# Patient Record
Sex: Male | Born: 1940 | Race: White | Hispanic: No | Marital: Single | Smoking: Never smoker
Health system: Southern US, Community
[De-identification: ages and names within clinical notes are randomized; demographics above are authoritative.]

---

## 2006-04-30 ENCOUNTER — Ambulatory Visit (HOSPITAL_COMMUNITY): Admission: RE | Admit: 2006-04-30 | Discharge: 2006-04-30 | Payer: Self-pay | Admitting: Family Medicine

## 2007-10-17 ENCOUNTER — Ambulatory Visit (HOSPITAL_COMMUNITY): Admission: RE | Admit: 2007-10-17 | Discharge: 2007-10-17 | Payer: Self-pay | Admitting: Family Medicine

## 2007-11-26 ENCOUNTER — Ambulatory Visit (HOSPITAL_COMMUNITY): Admission: RE | Admit: 2007-11-26 | Discharge: 2007-11-26 | Payer: Self-pay | Admitting: Family Medicine

## 2010-04-12 ENCOUNTER — Encounter: Payer: Self-pay | Admitting: Internal Medicine

## 2010-04-13 ENCOUNTER — Encounter: Payer: Self-pay | Admitting: Internal Medicine

## 2010-04-24 ENCOUNTER — Encounter: Payer: Self-pay | Admitting: Orthopaedic Surgery

## 2010-04-25 ENCOUNTER — Encounter: Payer: Self-pay | Admitting: Family Medicine

## 2010-04-25 ENCOUNTER — Ambulatory Visit (HOSPITAL_COMMUNITY): Admission: RE | Admit: 2010-04-25 | Payer: Self-pay | Source: Home / Self Care | Admitting: Internal Medicine

## 2010-05-05 NOTE — Letter (Addendum)
Summary: TRIAGE  TRIAGE   Imported By: Rexene Alberts 04/12/2010 15:48:41  _____________________________________________________________________  External Attachment:    Type:   Image     Comment:   External Document  Appended Document: TRIAGE ok as is  Appended Document: TRIAGE Rx and instructions were faxed to [facility] and order faxed to Kim.  Appended Document: TRIAGE Per Melanie in endo she called pt to remind him about his appt. and he stated he would like to cx because he has a family emergency. He will call the office to reschedule at a later time.

## 2010-05-05 NOTE — Letter (Signed)
Summary: OP REPORT FLEX SIG 03/1999  OP REPORT FLEX SIG 03/1999   Imported By: Rexene Alberts 04/12/2010 15:46:46  _____________________________________________________________________  External Attachment:    Type:   Image     Comment:   External Document

## 2010-05-05 NOTE — Letter (Signed)
Summary: [facility] PREP INSTRUCTIONS  [facility] PREP INSTRUCTIONS   Imported By: Rexene Alberts 04/13/2010 10:27:22  _____________________________________________________________________  External Attachment:    Type:   Image     Comment:   External Document

## 2012-12-03 ENCOUNTER — Ambulatory Visit (HOSPITAL_COMMUNITY)
Admission: RE | Admit: 2012-12-03 | Discharge: 2012-12-03 | Disposition: A | Discharge status: Home / Self Care | Payer: Medicare Other | Source: Ambulatory Visit | Attending: Family Medicine | Admitting: Family Medicine

## 2012-12-03 ENCOUNTER — Other Ambulatory Visit (HOSPITAL_COMMUNITY): Payer: Self-pay | Admitting: Family Medicine

## 2012-12-03 DIAGNOSIS — M25569 Pain in unspecified knee: Secondary | ICD-10-CM | POA: Insufficient documentation

## 2012-12-03 DIAGNOSIS — M1990 Unspecified osteoarthritis, unspecified site: Secondary | ICD-10-CM

## 2012-12-03 DIAGNOSIS — M25469 Effusion, unspecified knee: Secondary | ICD-10-CM | POA: Insufficient documentation

## 2017-06-04 ENCOUNTER — Other Ambulatory Visit (HOSPITAL_COMMUNITY): Payer: Self-pay | Admitting: Family Medicine

## 2017-06-04 DIAGNOSIS — M545 Low back pain: Secondary | ICD-10-CM

## 2017-06-14 ENCOUNTER — Ambulatory Visit (HOSPITAL_COMMUNITY): Payer: Medicare HMO

## 2017-06-20 ENCOUNTER — Ambulatory Visit (HOSPITAL_COMMUNITY)
Admission: RE | Admit: 2017-06-20 | Discharge: 2017-06-20 | Disposition: A | Discharge status: Home / Self Care | Payer: Medicare HMO | Source: Ambulatory Visit | Attending: Family Medicine | Admitting: Family Medicine

## 2017-06-20 DIAGNOSIS — M5126 Other intervertebral disc displacement, lumbar region: Secondary | ICD-10-CM | POA: Diagnosis not present

## 2017-06-20 DIAGNOSIS — M8938 Hypertrophy of bone, other site: Secondary | ICD-10-CM | POA: Insufficient documentation

## 2017-06-20 DIAGNOSIS — M48061 Spinal stenosis, lumbar region without neurogenic claudication: Secondary | ICD-10-CM | POA: Insufficient documentation

## 2017-06-20 DIAGNOSIS — M545 Low back pain: Secondary | ICD-10-CM | POA: Diagnosis present

## 2017-10-08 ENCOUNTER — Ambulatory Visit (HOSPITAL_COMMUNITY)
Admission: RE | Admit: 2017-10-08 | Discharge: 2017-10-08 | Disposition: A | Discharge status: Home / Self Care | Payer: Medicare HMO | Source: Ambulatory Visit | Attending: Family Medicine | Admitting: Family Medicine

## 2017-10-08 ENCOUNTER — Other Ambulatory Visit (HOSPITAL_COMMUNITY): Payer: Self-pay | Admitting: Family Medicine

## 2017-10-08 DIAGNOSIS — M65872 Other synovitis and tenosynovitis, left ankle and foot: Secondary | ICD-10-CM | POA: Insufficient documentation

## 2017-10-08 DIAGNOSIS — M779 Enthesopathy, unspecified: Secondary | ICD-10-CM

## 2019-09-11 IMAGING — DX DG OS CALCIS 2+V*L*
2 series · 2 of 2 positions shown · non-contrast
Comparison: None.

CLINICAL DATA: Bone spur.  Heel pain

EXAM:
LEFT OS CALCIS - 2+ VIEW

[calcaneus axial]
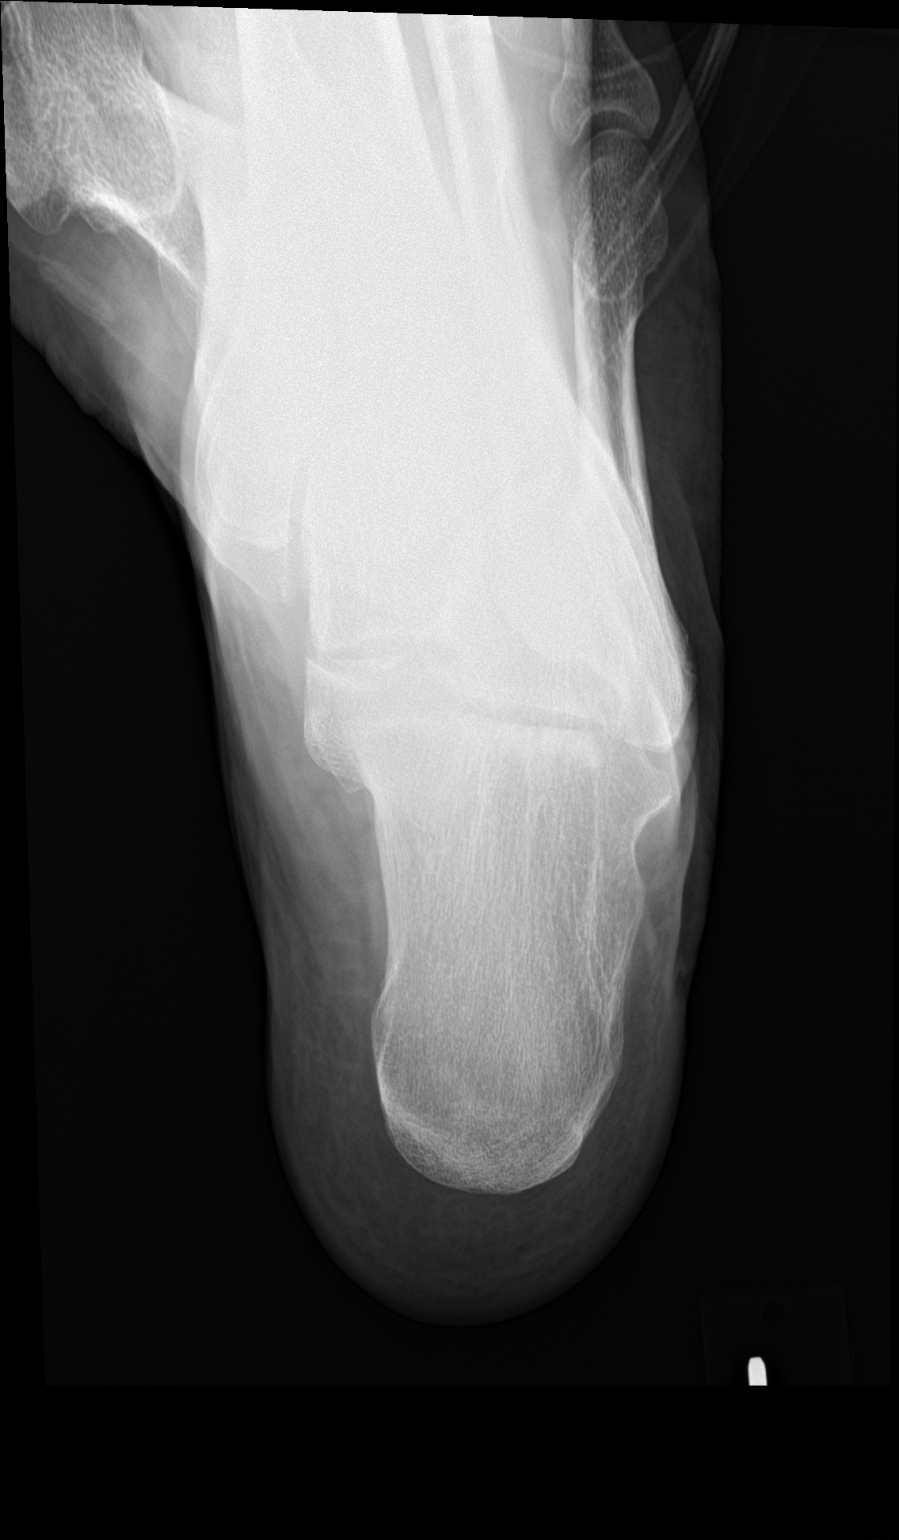

[calcaneus lat]
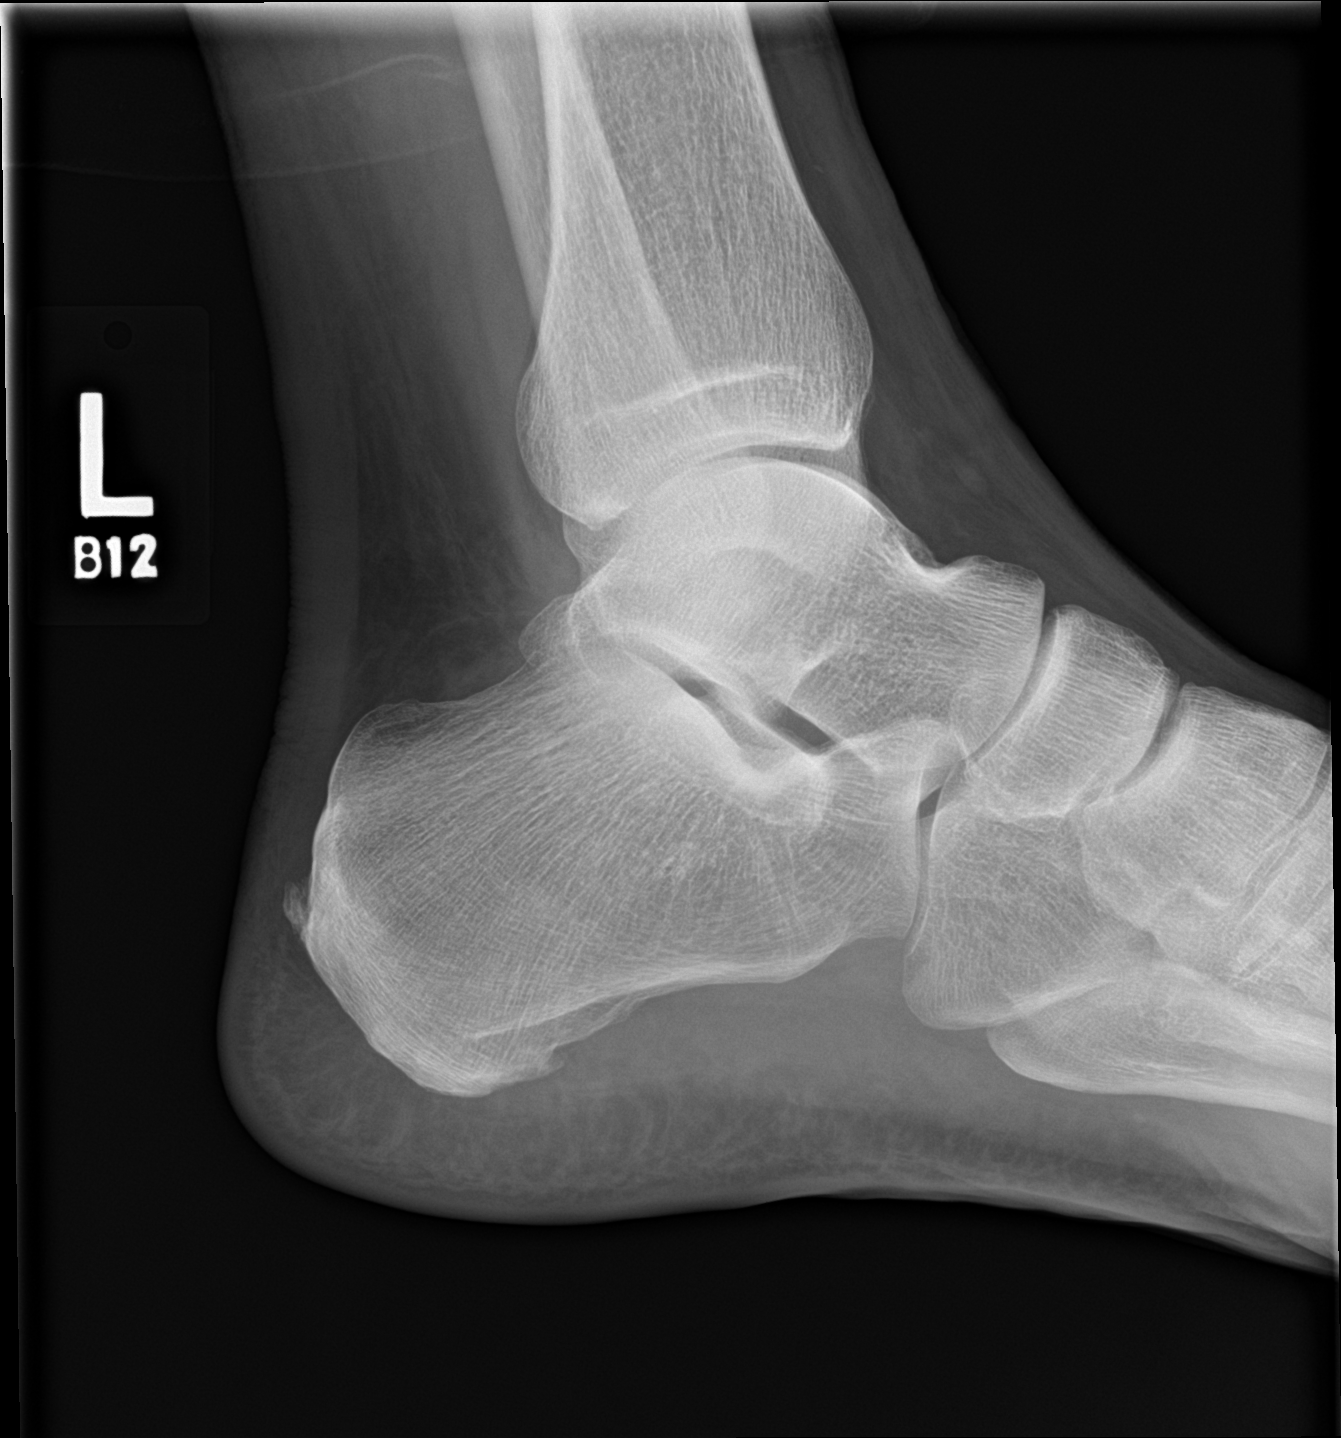

[2 of 2 positions shown; findings below may reference images not displayed]

FINDINGS: Normal alignment no fracture.  No bone lesion

Small amount of calcification at the Achilles tendon insertion. No
significant plantar spurring.
IMPRESSION: Mild calcification in the Achilles tendon.

## 2021-04-05 DIAGNOSIS — R110 Nausea: Secondary | ICD-10-CM | POA: Diagnosis not present

## 2021-04-05 DIAGNOSIS — J019 Acute sinusitis, unspecified: Secondary | ICD-10-CM | POA: Diagnosis not present

## 2021-04-05 DIAGNOSIS — R42 Dizziness and giddiness: Secondary | ICD-10-CM | POA: Diagnosis not present

## 2021-06-09 DIAGNOSIS — I10 Essential (primary) hypertension: Secondary | ICD-10-CM | POA: Diagnosis not present

## 2021-06-09 DIAGNOSIS — R7301 Impaired fasting glucose: Secondary | ICD-10-CM | POA: Diagnosis not present

## 2021-06-20 DIAGNOSIS — Z9889 Other specified postprocedural states: Secondary | ICD-10-CM | POA: Diagnosis not present

## 2021-06-20 DIAGNOSIS — G64 Other disorders of peripheral nervous system: Secondary | ICD-10-CM | POA: Diagnosis not present

## 2021-06-20 DIAGNOSIS — I10 Essential (primary) hypertension: Secondary | ICD-10-CM | POA: Diagnosis not present

## 2021-06-20 DIAGNOSIS — K219 Gastro-esophageal reflux disease without esophagitis: Secondary | ICD-10-CM | POA: Diagnosis not present

## 2021-06-20 DIAGNOSIS — G8929 Other chronic pain: Secondary | ICD-10-CM | POA: Diagnosis not present

## 2021-06-20 DIAGNOSIS — E782 Mixed hyperlipidemia: Secondary | ICD-10-CM | POA: Diagnosis not present

## 2021-06-20 DIAGNOSIS — D696 Thrombocytopenia, unspecified: Secondary | ICD-10-CM | POA: Diagnosis not present

## 2021-09-20 DIAGNOSIS — I10 Essential (primary) hypertension: Secondary | ICD-10-CM | POA: Diagnosis not present

## 2021-11-08 DIAGNOSIS — D72825 Bandemia: Secondary | ICD-10-CM | POA: Diagnosis not present

## 2021-11-08 DIAGNOSIS — R001 Bradycardia, unspecified: Secondary | ICD-10-CM | POA: Diagnosis not present

## 2021-11-08 DIAGNOSIS — D649 Anemia, unspecified: Secondary | ICD-10-CM | POA: Diagnosis not present

## 2021-11-08 DIAGNOSIS — I679 Cerebrovascular disease, unspecified: Secondary | ICD-10-CM | POA: Diagnosis not present

## 2021-11-08 DIAGNOSIS — M549 Dorsalgia, unspecified: Secondary | ICD-10-CM | POA: Diagnosis not present

## 2021-11-08 DIAGNOSIS — R825 Elevated urine levels of drugs, medicaments and biological substances: Secondary | ICD-10-CM | POA: Diagnosis not present

## 2021-11-08 DIAGNOSIS — W19XXXA Unspecified fall, initial encounter: Secondary | ICD-10-CM | POA: Diagnosis not present

## 2021-11-08 DIAGNOSIS — R059 Cough, unspecified: Secondary | ICD-10-CM | POA: Diagnosis not present

## 2021-11-08 DIAGNOSIS — R42 Dizziness and giddiness: Secondary | ICD-10-CM | POA: Diagnosis not present

## 2021-11-08 DIAGNOSIS — R296 Repeated falls: Secondary | ICD-10-CM | POA: Diagnosis not present

## 2021-11-08 DIAGNOSIS — Y92009 Unspecified place in unspecified non-institutional (private) residence as the place of occurrence of the external cause: Secondary | ICD-10-CM | POA: Diagnosis not present

## 2021-11-08 DIAGNOSIS — G8929 Other chronic pain: Secondary | ICD-10-CM | POA: Diagnosis not present

## 2021-11-08 DIAGNOSIS — D696 Thrombocytopenia, unspecified: Secondary | ICD-10-CM | POA: Diagnosis not present

## 2021-12-22 DIAGNOSIS — R7301 Impaired fasting glucose: Secondary | ICD-10-CM | POA: Diagnosis not present

## 2021-12-22 DIAGNOSIS — I10 Essential (primary) hypertension: Secondary | ICD-10-CM | POA: Diagnosis not present

## 2021-12-29 DIAGNOSIS — Z0000 Encounter for general adult medical examination without abnormal findings: Secondary | ICD-10-CM | POA: Diagnosis not present

## 2021-12-29 DIAGNOSIS — Z23 Encounter for immunization: Secondary | ICD-10-CM | POA: Diagnosis not present

## 2021-12-29 DIAGNOSIS — G64 Other disorders of peripheral nervous system: Secondary | ICD-10-CM | POA: Diagnosis not present

## 2021-12-29 DIAGNOSIS — E782 Mixed hyperlipidemia: Secondary | ICD-10-CM | POA: Diagnosis not present

## 2021-12-29 DIAGNOSIS — D696 Thrombocytopenia, unspecified: Secondary | ICD-10-CM | POA: Diagnosis not present

## 2021-12-29 DIAGNOSIS — Z9889 Other specified postprocedural states: Secondary | ICD-10-CM | POA: Diagnosis not present

## 2021-12-29 DIAGNOSIS — K219 Gastro-esophageal reflux disease without esophagitis: Secondary | ICD-10-CM | POA: Diagnosis not present

## 2021-12-29 DIAGNOSIS — Z0001 Encounter for general adult medical examination with abnormal findings: Secondary | ICD-10-CM | POA: Diagnosis not present

## 2021-12-29 DIAGNOSIS — I10 Essential (primary) hypertension: Secondary | ICD-10-CM | POA: Diagnosis not present

## 2022-01-19 ENCOUNTER — Other Ambulatory Visit (HOSPITAL_COMMUNITY): Payer: Self-pay | Admitting: Internal Medicine

## 2022-01-19 DIAGNOSIS — R112 Nausea with vomiting, unspecified: Secondary | ICD-10-CM | POA: Diagnosis not present

## 2022-01-20 ENCOUNTER — Other Ambulatory Visit: Payer: Self-pay

## 2022-01-20 ENCOUNTER — Emergency Department (HOSPITAL_COMMUNITY): Payer: Medicare Other

## 2022-01-20 ENCOUNTER — Emergency Department (HOSPITAL_COMMUNITY)
Admission: EM | Admit: 2022-01-20 | Discharge: 2022-01-20 | Disposition: A | Discharge status: Home / Self Care | Payer: Medicare Other | Attending: Emergency Medicine | Admitting: Emergency Medicine

## 2022-01-20 ENCOUNTER — Encounter (HOSPITAL_COMMUNITY): Payer: Self-pay | Admitting: Emergency Medicine

## 2022-01-20 DIAGNOSIS — R001 Bradycardia, unspecified: Secondary | ICD-10-CM | POA: Insufficient documentation

## 2022-01-20 DIAGNOSIS — Z20822 Contact with and (suspected) exposure to covid-19: Secondary | ICD-10-CM | POA: Insufficient documentation

## 2022-01-20 DIAGNOSIS — K5730 Diverticulosis of large intestine without perforation or abscess without bleeding: Secondary | ICD-10-CM | POA: Diagnosis not present

## 2022-01-20 DIAGNOSIS — R112 Nausea with vomiting, unspecified: Secondary | ICD-10-CM

## 2022-01-20 DIAGNOSIS — N281 Cyst of kidney, acquired: Secondary | ICD-10-CM | POA: Diagnosis not present

## 2022-01-20 DIAGNOSIS — R1084 Generalized abdominal pain: Secondary | ICD-10-CM | POA: Diagnosis not present

## 2022-01-20 LAB — COMPREHENSIVE METABOLIC PANEL
ALT: 14 U/L (ref 0–44)
AST: 23 U/L (ref 15–41)
Albumin: 4 g/dL (ref 3.5–5.0)
Alkaline Phosphatase: 58 U/L (ref 38–126)
Anion gap: 8 (ref 5–15)
BUN: 18 mg/dL (ref 8–23)
CO2: 28 mmol/L (ref 22–32)
Calcium: 8.9 mg/dL (ref 8.9–10.3)
Chloride: 104 mmol/L (ref 98–111)
Creatinine, Ser: 0.92 mg/dL (ref 0.61–1.24)
GFR, Estimated: >60 mL/min (ref >60)
Glucose, Bld: 108 mg/dL — ABNORMAL HIGH (ref 70–99)
Potassium: 3.8 mmol/L (ref 3.5–5.1)
Sodium: 140 mmol/L (ref 135–145)
Total Bilirubin: 1.7 mg/dL — ABNORMAL HIGH (ref 0.3–1.2)
Total Protein: 7.3 g/dL (ref 6.5–8.1)

## 2022-01-20 LAB — CBC
HCT: 39.8 % (ref 39.0–52.0)
Hemoglobin: 14.2 g/dL (ref 13.0–17.0)
MCH: 33.9 pg (ref 26.0–34.0)
MCHC: 35.7 g/dL (ref 30.0–36.0)
MCV: 95 fL (ref 80.0–100.0)
Platelets: 107 10*3/uL — ABNORMAL LOW (ref 150–400)
RBC: 4.19 MIL/uL — ABNORMAL LOW (ref 4.22–5.81)
RDW: 13.6 % (ref 11.5–15.5)
WBC: 5.3 10*3/uL (ref 4.0–10.5)
nRBC: 0 % (ref 0.0–0.2)

## 2022-01-20 LAB — URINALYSIS, ROUTINE W REFLEX MICROSCOPIC
Bilirubin Urine: NEGATIVE
Glucose, UA: NEGATIVE mg/dL
Hgb urine dipstick: NEGATIVE
Ketones, ur: 5 mg/dL — AB
Leukocytes,Ua: NEGATIVE
Nitrite: NEGATIVE
Protein, ur: NEGATIVE mg/dL
Specific Gravity, Urine: 1.012 (ref 1.005–1.030)
pH: 8 (ref 5.0–8.0)

## 2022-01-20 LAB — LIPASE, BLOOD: Lipase: 26 U/L (ref 11–51)

## 2022-01-20 LAB — RESP PANEL BY RT-PCR (FLU A&B, COVID) ARPGX2
Influenza A by PCR: NEGATIVE
Influenza B by PCR: NEGATIVE
SARS Coronavirus 2 by RT PCR: NEGATIVE

## 2022-01-20 LAB — MAGNESIUM: Magnesium: 2.1 mg/dL (ref 1.7–2.4)

## 2022-01-20 MED ORDER — IOHEXOL 300 MG/ML  SOLN
100.0000 mL | Freq: Once | INTRAMUSCULAR | Status: AC | PRN
  Administered 2022-01-20: 100 mL via INTRAVENOUS

## 2022-01-20 MED ORDER — SODIUM CHLORIDE 0.9 % IV BOLUS
1000.0000 mL | Freq: Once | INTRAVENOUS | Status: AC
  Administered 2022-01-20: 1000 mL via INTRAVENOUS

## 2022-01-20 MED ORDER — PANTOPRAZOLE SODIUM 40 MG IV SOLR
40.0000 mg | Freq: Once | INTRAVENOUS | Status: AC
  Administered 2022-01-20: 40 mg via INTRAVENOUS
  Filled 2022-01-20: qty 10

## 2022-01-20 MED ORDER — ONDANSETRON HCL 4 MG PO TABS: 4.0000 mg | ORAL_TABLET | Freq: Four times a day (QID) | ORAL | 0 refills | Status: AC

## 2022-01-20 MED ORDER — ONDANSETRON HCL 4 MG/2ML IJ SOLN
4.0000 mg | Freq: Once | INTRAMUSCULAR | Status: AC
  Administered 2022-01-20: 4 mg via INTRAVENOUS
  Filled 2022-01-20: qty 2

## 2022-01-20 NOTE — ED Provider Notes (Signed)
   Clinical Course as of 01/20/22 1308  Ludwig Clarks Jan 20, 2022  1526 Received sign out from Dr. Melina Copa pending CT scan, ~1-2 weeks of N/V and abdominal pain. Labs reassuring. Will re-evaluate after imaging.  [WS]  1832 Laboratory testing reassuring. 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 Advised outpatient follow up with PMD. Will discharge patient to home. All questions answered. Patient comfortable with plan of discharge. Return precautions discussed with patient and specified on the after visit summary.  [WS]    Clinical Course User Index [WS] Cristie Hem, MD   Medical Decision Making Amount and/or Complexity of Data Reviewed Labs: ordered. Radiology: ordered.  Risk Prescription drug management.          Cristie Hem, MD 01/20/22 1924

## 2022-01-20 NOTE — Discharge Instructions (Addendum)
We evaluated you for your nausea and vomiting. Your testing was reassuring. Please follow up with your primary doctor and gastroenterology.

## 2022-01-20 NOTE — ED Triage Notes (Signed)
N/V x 2 days. LBM x 2 days ago-small and soft.

## 2022-01-20 NOTE — ED Provider Notes (Signed)
[facility] EMERGENCY DEPARTMENT Provider Note   CSN: [id number] Arrival date & time: 01/20/22  1336     History  Chief Complaint  Patient presents with   Emesis    Kyle Mann is a 81 y.o. male.  He has no significant past medical history.  He is complaining of nausea and vomiting that has been going on for a week or 2.  It is associated with some vague abdominal pain.  He denies any fevers chills chest pain shortness of breath diarrhea constipation or urinary symptoms.  No change in diet no recent travel no trauma.  He has tried nothing for his symptoms.  The history is provided by the patient.  Emesis Severity:  Severe Duration:  2 weeks Timing:  Intermittent Quality:  Stomach contents Progression:  Unchanged Chronicity:  New Relieved by:  None tried Worsened by:  Liquids Ineffective treatments:  Liquids Associated symptoms: abdominal pain   Associated symptoms: no chills, no cough, no diarrhea, no fever, no headaches and no myalgias   Risk factors: no sick contacts, no suspect food intake and no travel to endemic areas        Home Medications Prior to Admission medications   Not on File      Allergies    Patient has no known allergies.    Review of Systems   Review of Systems  Constitutional:  Negative for chills and fever.  Eyes:  Negative for visual disturbance.  Respiratory:  Negative for cough.   Gastrointestinal:  Positive for abdominal pain, nausea and vomiting. Negative for diarrhea.  Genitourinary:  Negative for dysuria.  Musculoskeletal:  Negative for myalgias.  Neurological:  Negative for headaches.    Physical Exam Updated Vital Signs BP (!) 140/92 (BP Location: Right Arm)   Pulse (!) 46   Temp 97.6 F (36.4 C) (Oral)   Resp 18   Ht 6\' 1"  (1.854 m)   Wt 93 kg   SpO2 100%   BMI 27.05 kg/m  Physical Exam Vitals and nursing note reviewed.  Constitutional:      General: He is not in acute distress.    Appearance: Normal appearance.  He is well-developed.  HENT:     Head: Normocephalic and atraumatic.  Eyes:     Conjunctiva/sclera: Conjunctivae normal.  Cardiovascular:     Rate and Rhythm: Regular rhythm. Bradycardia present.     Heart sounds: No murmur heard. Pulmonary:     Effort: Pulmonary effort is normal. No respiratory distress.     Breath sounds: Normal breath sounds.  Abdominal:     Palpations: Abdomen is soft.     Tenderness: There is no abdominal tenderness. There is no guarding or rebound.  Musculoskeletal:        General: Normal range of motion.     Cervical back: Neck supple.     Right lower leg: No edema.     Left lower leg: No edema.  Skin:    General: Skin is warm and dry.     Capillary Refill: Capillary refill takes less than 2 seconds.  Neurological:     General: No focal deficit present.     Mental Status: He is alert.     Sensory: No sensory deficit.     Motor: No weakness.     ED Results / Procedures / Treatments   Labs (all labs ordered are listed, but only abnormal results are displayed) Labs Reviewed  COMPREHENSIVE METABOLIC PANEL - Abnormal; Notable for the following components:  Result Value   Glucose, Bld 108 (*)    Total Bilirubin 1.7 (*)    All other components within normal limits  CBC - Abnormal; Notable for the following components:   RBC 4.19 (*)    Platelets 107 (*)    All other components within normal limits  RESP PANEL BY RT-PCR (FLU A&B, COVID) ARPGX2  LIPASE, BLOOD  MAGNESIUM  URINALYSIS, ROUTINE W REFLEX MICROSCOPIC    EKG EKG Interpretation  Date/Time:  Friday January 20 2022 13:59:46 EDT Ventricular Rate:  48 PR Interval:  174 QRS Duration: 90 QT Interval:  452 QTC Calculation: 403 R Axis:   9 Text Interpretation: Sinus bradycardia Minimal voltage criteria for LVH, may be normal variant ( R in aVL ) Possible Inferior infarct , age undetermined Abnormal ECG No previous ECGs available Confirmed by Meridee Score [phone]) on 01/20/2022 2:01:05  PM  Radiology CT Abdomen Pelvis W Contrast  Result Date: 01/20/2022 CLINICAL DATA:  81 year old presents with nausea vomiting. EXAM: CT ABDOMEN AND PELVIS WITH CONTRAST TECHNIQUE: Multidetector CT imaging of the abdomen and pelvis was performed using the standard protocol following bolus administration of intravenous contrast. RADIATION DOSE REDUCTION: This exam was performed according to the departmental dose-optimization program which includes automated exposure control, adjustment of the mA and/or kV according to patient size and/or use of iterative reconstruction technique. CONTRAST:  OMNIPAQUE IOHEXOL 300 MG/ML  SOLN COMPARISON:  None available. FINDINGS: Lower chest: Mild basilar atelectasis. No effusion or consolidative changes. Visualized cardiac structures with signs of coronary artery disease of LEFT and RIGHT coronary circulation. No pericardial effusion. Heart is incompletely imaged. Chest wall is unremarkable. Hepatobiliary: No focal, suspicious hepatic lesion. Fissural widening of hepatic fissures. No pericholecystic stranding. No biliary duct dilation. Portal vein is patent. Pancreas: Mild atrophy of the pancreas without ductal dilation, inflammation or visible lesion. Spleen: Mild splenomegaly approximately 14 cm greatest craniocaudal dimension. Adrenals/Urinary Tract: Adrenal glands are unremarkable. Symmetric renal enhancement. No sign of hydronephrosis. No suspicious renal lesion or perinephric stranding. Urinary bladder is grossly unremarkable. 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 Other lesion in the lower pole less than a cm showing low-density and a third lesion measuring 8 Hounsfield units in the lower pole at 11 mm. No additional dedicated imaging follow-up for these findings is recommended. Stomach/Bowel: No acute gastrointestinal findings. Scattered colonic diverticulosis.  Normal appendix. Vascular/Lymphatic: Aortic atherosclerosis. No sign of aneurysm. Smooth contour of the IVC. There is no gastrohepatic or hepatoduodenal ligament lymphadenopathy. No retroperitoneal or mesenteric lymphadenopathy. No pelvic sidewall lymphadenopathy. Reproductive: Mild prostatomegaly.  Nonspecific on CT. Other: Small to moderate fat containing LEFT inguinal hernia. No surrounding stranding. No ascites. No pneumoperitoneum. Musculoskeletal: No acute bone finding. No destructive bone process. Spinal degenerative changes. 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 Expansion of the LEFT pubic bone noted on scout images for barium enema dating back to 2008. IMPRESSION: 1. No acute findings in the abdomen or pelvis. 2. Mild splenomegaly also with lobulated hepatic contours, correlate with any clinical or laboratory evidence of liver disease. 3. Small to moderate fat containing LEFT inguinal hernia. No surrounding stranding. 4. 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 Expansion of the LEFT pubic bone noted on scout images for barium enema dating back to 2008. 5. Mild prostatomegaly. Nonspecific on CT. 6.  Coronary artery disease. 7. Aortic atherosclerosis. Aortic Atherosclerosis (ICD10-I70.0). Electronically Signed   By: Donzetta Kohut M.D.   On: 01/20/2022 17:18    Procedures Procedures    Medications Ordered in ED Medications  sodium chloride 0.9 % bolus 1,000 mL (has no administration in time range)  ondansetron (ZOFRAN) injection 4 mg (has no administration in time range)  pantoprazole (PROTONIX) injection 40 mg (has no administration in time range)    ED Course/ Medical Decision Making/ A&P Clinical Course as of 01/20/22 1729  Fri Jan 20, 2022  1526 Received sign out from Dr. Charm Barges pending CT scan, ~1-2 weeks of N/V and abdominal pain. Labs reassuring. Will re-evaluate  after imaging.  [WS]    Clinical Course User Index [WS] Lonell Grandchild, MD                           Medical Decision Making Amount and/or Complexity of Data Reviewed Labs: ordered. Radiology: ordered.  Risk Prescription drug management.   This patient complains of nausea vomiting abdominal pain; this involves an extensive number of treatment Options and is a complaint that carries with it a high risk of complications and morbidity. The differential includes gastritis, peptic ulcer disease, obstruction, gastroenteritis, pancreatitis, hepatitis  I ordered, reviewed and interpreted labs, which included CBC normal chemistries and LFTs normal COVID and flu negative I ordered medication IV fluids Protonix and nausea medication and reviewed PMP when indicated. I ordered imaging studies which included CT abdomen and pelvis and this is pending at time of signout  additional history obtained from patient's companion Previous records obtained and reviewed in epic no recent admissions Cardiac monitoring reviewed, normal sinus rhythm  Social determinants considered, no significant barriers Critical Interventions: None  After the interventions stated above, I reevaluated the patient and found patient to be otherwise well-appearing in no distress Admission and further testing considered, patient's care signed out to Dr. Suezanne Jacquet to follow-up on results of CT.  If no acute findings p.o. trial and see if he is appropriate for outpatient follow-up.         Final Clinical Impression(s) / ED Diagnoses Final diagnoses:  Nausea and vomiting, unspecified vomiting type  Generalized abdominal pain    Rx / DC Orders ED Discharge Orders     None         Terrilee Files, MD 01/20/22 1732

## 2022-02-01 ENCOUNTER — Encounter (HOSPITAL_COMMUNITY): Payer: Self-pay

## 2022-02-01 ENCOUNTER — Ambulatory Visit (HOSPITAL_COMMUNITY): Admission: RE | Admit: 2022-02-01 | Payer: Medicare Other | Source: Ambulatory Visit

## 2022-02-27 ENCOUNTER — Emergency Department (HOSPITAL_COMMUNITY): Payer: Medicare Other

## 2022-02-27 ENCOUNTER — Other Ambulatory Visit: Payer: Self-pay

## 2022-02-27 ENCOUNTER — Encounter (HOSPITAL_COMMUNITY): Payer: Self-pay

## 2022-02-27 ENCOUNTER — Emergency Department (HOSPITAL_COMMUNITY)
Admission: EM | Admit: 2022-02-27 | Discharge: 2022-02-27 | Disposition: A | Discharge status: Home / Self Care | Payer: Medicare Other | Attending: Emergency Medicine | Admitting: Emergency Medicine

## 2022-02-27 DIAGNOSIS — S6992XA Unspecified injury of left wrist, hand and finger(s), initial encounter: Secondary | ICD-10-CM | POA: Diagnosis present

## 2022-02-27 DIAGNOSIS — S62637A Displaced fracture of distal phalanx of left little finger, initial encounter for closed fracture: Secondary | ICD-10-CM | POA: Diagnosis not present

## 2022-02-27 DIAGNOSIS — W3400XA Accidental discharge from unspecified firearms or gun, initial encounter: Secondary | ICD-10-CM | POA: Insufficient documentation

## 2022-02-27 DIAGNOSIS — S61217A Laceration without foreign body of left little finger without damage to nail, initial encounter: Secondary | ICD-10-CM | POA: Diagnosis not present

## 2022-02-27 MED ORDER — LIDOCAINE HCL (PF) 2 % IJ SOLN
INTRAMUSCULAR | Status: AC
  Filled 2022-02-27: qty 20

## 2022-02-27 MED ORDER — LIDOCAINE HCL (PF) 2 % IJ SOLN
10.0000 mL | Freq: Once | INTRAMUSCULAR | Status: AC
  Administered 2022-02-27: 10 mL via INTRADERMAL

## 2022-02-27 MED ORDER — CEPHALEXIN 500 MG PO CAPS: 500.0000 mg | ORAL_CAPSULE | Freq: Two times a day (BID) | ORAL | 0 refills | Status: AC

## 2022-02-27 MED ORDER — CEPHALEXIN 500 MG PO CAPS
500.0000 mg | ORAL_CAPSULE | Freq: Once | ORAL | Status: AC
  Administered 2022-02-27: 500 mg via ORAL
  Filled 2022-02-27: qty 1

## 2022-02-27 MED ORDER — HYDROCODONE-ACETAMINOPHEN 5-325 MG PO TABS: 1.0000 | ORAL_TABLET | Freq: Four times a day (QID) | ORAL | 0 refills | Status: AC | PRN

## 2022-02-27 MED ORDER — POVIDONE-IODINE 10 % EX SOLN
CUTANEOUS | Status: AC
  Filled 2022-02-27: qty 14.8

## 2022-02-27 MED ORDER — HYDROCODONE-ACETAMINOPHEN 5-325MG PREPACK (~~LOC~~: ORAL_TABLET | ORAL | 0 refills | Status: AC

## 2022-02-27 MED ORDER — IBUPROFEN 600 MG PO TABS: 600.0000 mg | ORAL_TABLET | Freq: Four times a day (QID) | ORAL | 0 refills | Status: AC | PRN
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
## 2022-02-27 NOTE — ED Provider Notes (Signed)
   This was a shared visit.  I was asked by my attending physician to prepare wound of the patient's left fifth finger.  This was my only involvement in this patient's care.    Marland Kitchen.Laceration Repair  Date/Time: 02/27/2022 9:36 PM  Performed by: Pauline Aus, PA-C Authorized by: Pauline Aus, PA-C   Consent:    Consent obtained:  Verbal   Consent given by:  Patient   Risks discussed:  Poor cosmetic result, poor wound healing, nerve damage, need for additional repair, infection and pain Universal protocol:    Imaging studies available: yes     Immediately prior to procedure, a time out was called: yes     Patient identity confirmed:  Verbally with patient and arm band Anesthesia:    Anesthesia method:  Nerve block   Block location:  Left fifth finger   Block needle gauge:  25 G   Block anesthetic:  Lidocaine 2% w/o epi   Block technique:  Digital block   Block injection procedure:  Anatomic landmarks identified, introduced needle and negative aspiration for blood   Block outcome:  Anesthesia achieved Laceration details:    Location:  Finger   Finger location:  L small finger   Length (cm):  4 Pre-procedure details:    Preparation:  Patient was prepped and draped in usual sterile fashion and imaging obtained to evaluate for foreign bodies Exploration:    Limited defect created (wound extended): no     Hemostasis achieved with:  Direct pressure   Imaging obtained: x-ray     Imaging outcome: foreign body not noted     Wound exploration: wound explored through full range of motion and entire depth of wound visualized     Contaminated: no   Treatment:    Area cleansed with:  Povidone-iodine and saline   Amount of cleaning:  Standard   Irrigation solution:  Sterile saline   Irrigation method:  Syringe   Visualized foreign bodies/material removed: no   Skin repair:    Repair method:  Sutures   Suture size:  4-0   Suture material:  Nylon   Suture technique:  Simple  interrupted   Number of sutures:  7 Approximation:    Approximation:  Loose Repair type:    Repair type:  Intermediate Post-procedure details:    Dressing:  Non-adherent dressing, splint for protection and sterile dressing   Procedure completion:  Tolerated well, no immediate complications     Pauline Aus, PA-C 02/27/22 2142    Terald Sleeper, MD 02/28/22 [phone]

## 2022-02-27 NOTE — Discharge Instructions (Addendum)
Keep your finger wound clean and dry for the next several days.  Call tomorrow to schedule follow-up appoint with a hand surgeon at the number above.  Let them know you are seen in the emergency department and referred in to the clinic.  You should ideally be seen within 7 days to have your wound looked at again.  The stitches will likely need to be removed within 7 to 10 days, but this should be done after you are seen by a specialist.  Please continue taking the antibiotic Keflex tomorrow as prescribed.  This is to prevent infection.

## 2022-02-28 MED FILL — Hydrocodone-Acetaminophen Tab 5-325 MG: ORAL | Qty: 6 | Status: AC

## 2022-03-07 ENCOUNTER — Telehealth: Payer: Self-pay | Admitting: Orthopedic Surgery

## 2022-03-07 NOTE — Telephone Encounter (Signed)
I tried to called the patient and his emergency contact multiple times, but voicemail is not setup, so I was not able to leave a message.  The patient doesn't have email or mychart.  If the patient calls back, Dr. Dallas Schimke said he could see the patient in the [facility] office on 03/08/22 or in the [place] office on 03/10/22.
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
## 2022-03-10 ENCOUNTER — Ambulatory Visit: Payer: Medicare Other | Admitting: Orthopedic Surgery

## 2022-03-10 ENCOUNTER — Encounter: Payer: Self-pay | Admitting: Orthopedic Surgery

## 2022-03-10 VITALS — BP 131/74 | HR 42 | Ht 73.0 in | Wt 212.0 lb

## 2022-03-10 DIAGNOSIS — S62637B Displaced fracture of distal phalanx of left little finger, initial encounter for open fracture: Secondary | ICD-10-CM

## 2022-03-10 DIAGNOSIS — W3400XA Accidental discharge from unspecified firearms or gun, initial encounter: Secondary | ICD-10-CM | POA: Diagnosis not present
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
## 2022-03-10 NOTE — Patient Instructions (Signed)
Finish the antibiotics  Change her dressing daily  Keep your finger clean  Elevate to help with swelling  See back on 12/14 around 930

## 2022-03-16 ENCOUNTER — Ambulatory Visit (INDEPENDENT_AMBULATORY_CARE_PROVIDER_SITE_OTHER): Payer: Medicare Other

## 2022-03-16 ENCOUNTER — Encounter: Payer: Self-pay | Admitting: Orthopedic Surgery

## 2022-03-16 ENCOUNTER — Ambulatory Visit (INDEPENDENT_AMBULATORY_CARE_PROVIDER_SITE_OTHER): Payer: Medicare Other | Admitting: Orthopedic Surgery

## 2022-03-16 VITALS — Ht 73.0 in | Wt 212.0 lb

## 2022-03-16 DIAGNOSIS — S62637S Displaced fracture of distal phalanx of left little finger, sequela: Secondary | ICD-10-CM
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
## 2022-03-16 NOTE — Patient Instructions (Addendum)
Continue with daily dressing changes  Ok to allow the wound to dry up and scab over  Please call the office if you have any concerns.

## 2022-04-11 ENCOUNTER — Ambulatory Visit (INDEPENDENT_AMBULATORY_CARE_PROVIDER_SITE_OTHER): Payer: Medicare Other | Admitting: Orthopedic Surgery

## 2022-04-11 ENCOUNTER — Encounter: Payer: Self-pay | Admitting: Orthopedic Surgery

## 2022-04-11 ENCOUNTER — Ambulatory Visit (INDEPENDENT_AMBULATORY_CARE_PROVIDER_SITE_OTHER): Payer: Medicare Other

## 2022-04-11 VITALS — Ht 73.0 in | Wt 212.0 lb

## 2022-04-11 DIAGNOSIS — S62637S Displaced fracture of distal phalanx of left little finger, sequela: Secondary | ICD-10-CM
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
## 2022-04-12 ENCOUNTER — Encounter: Payer: Self-pay | Admitting: Orthopedic Surgery

## 2022-06-23 DIAGNOSIS — I10 Essential (primary) hypertension: Secondary | ICD-10-CM | POA: Diagnosis not present

## 2022-06-28 DIAGNOSIS — Z0000 Encounter for general adult medical examination without abnormal findings: Secondary | ICD-10-CM | POA: Diagnosis not present

## 2022-06-29 DIAGNOSIS — Z79899 Other long term (current) drug therapy: Secondary | ICD-10-CM | POA: Diagnosis not present

## 2022-06-29 DIAGNOSIS — D696 Thrombocytopenia, unspecified: Secondary | ICD-10-CM | POA: Diagnosis not present

## 2022-06-29 DIAGNOSIS — I700 Atherosclerosis of aorta: Secondary | ICD-10-CM | POA: Diagnosis not present

## 2022-06-29 DIAGNOSIS — M549 Dorsalgia, unspecified: Secondary | ICD-10-CM | POA: Diagnosis not present

## 2022-06-29 DIAGNOSIS — Z79891 Long term (current) use of opiate analgesic: Secondary | ICD-10-CM | POA: Diagnosis not present

## 2022-06-29 DIAGNOSIS — I10 Essential (primary) hypertension: Secondary | ICD-10-CM | POA: Diagnosis not present

## 2022-06-29 DIAGNOSIS — G8929 Other chronic pain: Secondary | ICD-10-CM | POA: Diagnosis not present

## 2022-06-29 DIAGNOSIS — K219 Gastro-esophageal reflux disease without esophagitis: Secondary | ICD-10-CM | POA: Diagnosis not present

## 2022-06-29 DIAGNOSIS — E785 Hyperlipidemia, unspecified: Secondary | ICD-10-CM | POA: Diagnosis not present

## 2022-12-27 DIAGNOSIS — E785 Hyperlipidemia, unspecified: Secondary | ICD-10-CM | POA: Diagnosis not present

## 2023-01-01 DIAGNOSIS — I10 Essential (primary) hypertension: Secondary | ICD-10-CM | POA: Diagnosis not present

## 2023-01-01 DIAGNOSIS — Z79899 Other long term (current) drug therapy: Secondary | ICD-10-CM | POA: Diagnosis not present

## 2023-01-01 DIAGNOSIS — K219 Gastro-esophageal reflux disease without esophagitis: Secondary | ICD-10-CM | POA: Diagnosis not present

## 2023-01-01 DIAGNOSIS — S62637D Displaced fracture of distal phalanx of left little finger, subsequent encounter for fracture with routine healing: Secondary | ICD-10-CM | POA: Diagnosis not present

## 2023-01-01 DIAGNOSIS — D696 Thrombocytopenia, unspecified: Secondary | ICD-10-CM | POA: Diagnosis not present

## 2023-01-01 DIAGNOSIS — E785 Hyperlipidemia, unspecified: Secondary | ICD-10-CM | POA: Diagnosis not present

## 2023-01-01 DIAGNOSIS — I700 Atherosclerosis of aorta: Secondary | ICD-10-CM | POA: Diagnosis not present

## 2023-01-01 DIAGNOSIS — G8929 Other chronic pain: Secondary | ICD-10-CM | POA: Diagnosis not present
